# Patient Record
Sex: Female | Born: 1980 | Race: White | Hispanic: No | Marital: Married | Smoking: Current every day smoker
Health system: Southern US, Community
[De-identification: ages and names within clinical notes are randomized; demographics above are authoritative.]

## PROBLEM LIST (undated history)

## (undated) DIAGNOSIS — N809 Endometriosis, unspecified: Secondary | ICD-10-CM

## (undated) DIAGNOSIS — J45909 Unspecified asthma, uncomplicated: Secondary | ICD-10-CM

## (undated) DIAGNOSIS — E119 Type 2 diabetes mellitus without complications: Secondary | ICD-10-CM

## (undated) HISTORY — PX: TUBAL LIGATION: SHX77

---

## 2015-10-14 ENCOUNTER — Emergency Department (HOSPITAL_COMMUNITY): Payer: Self-pay

## 2015-10-14 ENCOUNTER — Encounter (HOSPITAL_COMMUNITY): Payer: Self-pay | Admitting: Emergency Medicine

## 2015-10-14 ENCOUNTER — Emergency Department (HOSPITAL_COMMUNITY)
Admission: EM | Admit: 2015-10-14 | Discharge: 2015-10-14 | Disposition: A | Discharge status: Home / Self Care | Payer: Self-pay | Attending: Emergency Medicine | Admitting: Emergency Medicine

## 2015-10-14 DIAGNOSIS — Z9851 Tubal ligation status: Secondary | ICD-10-CM | POA: Insufficient documentation

## 2015-10-14 DIAGNOSIS — R1031 Right lower quadrant pain: Secondary | ICD-10-CM | POA: Insufficient documentation

## 2015-10-14 DIAGNOSIS — F17210 Nicotine dependence, cigarettes, uncomplicated: Secondary | ICD-10-CM | POA: Insufficient documentation

## 2015-10-14 DIAGNOSIS — Z8742 Personal history of other diseases of the female genital tract: Secondary | ICD-10-CM | POA: Insufficient documentation

## 2015-10-14 DIAGNOSIS — Z880 Allergy status to penicillin: Secondary | ICD-10-CM | POA: Insufficient documentation

## 2015-10-14 DIAGNOSIS — E119 Type 2 diabetes mellitus without complications: Secondary | ICD-10-CM | POA: Insufficient documentation

## 2015-10-14 DIAGNOSIS — R109 Unspecified abdominal pain: Secondary | ICD-10-CM

## 2015-10-14 HISTORY — DX: Type 2 diabetes mellitus without complications: E11.9

## 2015-10-14 HISTORY — DX: Endometriosis, unspecified: N80.9

## 2015-10-14 HISTORY — DX: Unspecified asthma, uncomplicated: J45.909

## 2015-10-14 LAB — BASIC METABOLIC PANEL
Anion gap: 10 (ref 5–15)
BUN: 10 mg/dL (ref 6–20)
CALCIUM: 9.2 mg/dL (ref 8.9–10.3)
CHLORIDE: 108 mmol/L (ref 101–111)
CO2: 22 mmol/L (ref 22–32)
Creatinine, Ser: 0.69 mg/dL (ref 0.44–1.00)
GFR calc Af Amer: >60 mL/min (ref >60)
Glucose, Bld: 76 mg/dL (ref 65–99)
POTASSIUM: 4.3 mmol/L (ref 3.5–5.1)
SODIUM: 140 mmol/L (ref 135–145)

## 2015-10-14 LAB — CBC WITH DIFFERENTIAL/PLATELET
Basophils Absolute: 0 10*3/uL (ref 0.0–0.1)
Basophils Relative: 0 %
EOS ABS: 0.2 10*3/uL (ref 0.0–0.7)
Eosinophils Relative: 2 %
HEMATOCRIT: 39.4 % (ref 36.0–46.0)
HEMOGLOBIN: 12.5 g/dL (ref 12.0–15.0)
LYMPHS ABS: 2.4 10*3/uL (ref 0.7–4.0)
LYMPHS PCT: 30 %
MCH: 25.5 pg — AB (ref 26.0–34.0)
MCHC: 31.7 g/dL (ref 30.0–36.0)
MCV: 80.2 fL (ref 78.0–100.0)
Monocytes Absolute: 0.5 10*3/uL (ref 0.1–1.0)
Monocytes Relative: 6 %
NEUTROS ABS: 4.9 10*3/uL (ref 1.7–7.7)
NEUTROS PCT: 61 %
Platelets: 282 10*3/uL (ref 150–400)
RBC: 4.91 MIL/uL (ref 3.87–5.11)
RDW: 13.5 % (ref 11.5–15.5)
WBC: 7.9 10*3/uL (ref 4.0–10.5)

## 2015-10-14 LAB — I-STAT BETA HCG BLOOD, ED (MC, WL, AP ONLY)

## 2015-10-14 LAB — URINE MICROSCOPIC-ADD ON: WBC, UA: NONE SEEN WBC/hpf (ref 0–5)

## 2015-10-14 LAB — URINALYSIS, ROUTINE W REFLEX MICROSCOPIC
BILIRUBIN URINE: NEGATIVE
Glucose, UA: NEGATIVE mg/dL
KETONES UR: NEGATIVE mg/dL
Leukocytes, UA: NEGATIVE
Nitrite: NEGATIVE
PROTEIN: NEGATIVE mg/dL
Specific Gravity, Urine: 1.011 (ref 1.005–1.030)
pH: 7 (ref 5.0–8.0)

## 2015-10-14 MED ORDER — ONDANSETRON HCL 4 MG/2ML IJ SOLN
4.0000 mg | Freq: Once | INTRAMUSCULAR | Status: AC
  Administered 2015-10-14: 4 mg via INTRAVENOUS
  Filled 2015-10-14: qty 2

## 2015-10-14 MED ORDER — HYDROCODONE-ACETAMINOPHEN 5-325 MG PO TABS: 1.0000 | ORAL_TABLET | Freq: Four times a day (QID) | ORAL | Status: AC | PRN

## 2015-10-14 MED ORDER — MORPHINE SULFATE (PF) 4 MG/ML IV SOLN
4.0000 mg | Freq: Once | INTRAVENOUS | Status: AC
  Administered 2015-10-14: 4 mg via INTRAVENOUS
  Filled 2015-10-14: qty 1

## 2015-10-14 MED ORDER — IOPAMIDOL (ISOVUE-300) INJECTION 61%
INTRAVENOUS | Status: AC
  Administered 2015-10-14: 100 mL
  Filled 2015-10-14: qty 100

## 2015-10-14 NOTE — ED Provider Notes (Signed)
CSN: [id number]     Arrival date & time 10/14/15  4098 History   First MD Initiated Contact with Patient 10/14/15 1005     Chief Complaint  Patient presents with  . Abdominal Pain     (Consider location/radiation/quality/duration/timing/severity/associated sxs/prior Treatment) HPI Comments: Patient presents to the emergency department with chief complaint of right lower quadrant abdominal pain. She states that the pain started yesterday and has progressively worsened. She reports associated nausea, but denies any vomiting or diarrhea. She states that she has had some urinary frequency with large blood clots, but denies any dysuria. She denies ever having had a kidney stone. Denies any prior abdominal surgeries. She states that she does have endometriosis, but states that this feels unlike her typical menstrual pain. She reports that she has on her menstrual cycle now. Additionally, patient complains of having had swollen hands and face this morning, but this is now resolved. She has not taken anything for her symptoms.  The history is provided by the patient. No language interpreter was used.    Past Medical History  Diagnosis Date  . Endometriosis   . Diabetes mellitus without complication [facility])    Past Surgical History  Procedure Laterality Date  . Tubal ligation     No family history on file. Social History  Substance Use Topics  . Smoking status: Current Every Day Smoker -- 0.50 packs/day    Types: Cigarettes  . Smokeless tobacco: None  . Alcohol Use: No   OB History    No data available     Review of Systems  Constitutional: Negative for fever and chills.  Respiratory: Negative for shortness of breath.   Cardiovascular: Negative for chest pain.  Gastrointestinal: Positive for nausea and abdominal pain. Negative for vomiting, diarrhea and constipation.  Genitourinary: Negative for dysuria.  All other systems reviewed and are negative.     Allergies   Penicillins  Home Medications   Prior to Admission medications   Not on File   BP 117/91 mmHg  Pulse 86  SpO2 100%  LMP 10/12/2015 (Exact Date) Physical Exam  Constitutional: She is oriented to person, place, and time. She appears well-developed and well-nourished.  HENT:  Head: Normocephalic and atraumatic.  Eyes: Conjunctivae and EOM are normal. Pupils are equal, round, and reactive to light.  Neck: Normal range of motion. Neck supple.  Cardiovascular: Normal rate and regular rhythm.  Exam reveals no gallop and no friction rub.   No murmur heard. Pulmonary/Chest: Effort normal and breath sounds normal. No respiratory distress. She has no wheezes. She has no rales. She exhibits no tenderness.  Abdominal: Soft. Bowel sounds are normal. She exhibits no distension and no mass. There is tenderness. There is no rebound and no guarding.  Right lower quadrant is tender to palpation, no other focal abdominal tenderness  Musculoskeletal: Normal range of motion. She exhibits no edema or tenderness.  Neurological: She is alert and oriented to person, place, and time.  Skin: Skin is warm and dry.  Skin on hands appears flushed, but no obvious swelling  Psychiatric: She has a normal mood and affect. Her behavior is normal. Judgment and thought content normal.  Nursing note and vitals reviewed.   ED Course  Procedures (including critical care time) Results for orders placed or performed during the hospital encounter of 10/14/15  CBC with Differential/Platelet  Result Value Ref Range   WBC 7.9 4.0 - 10.5 K/uL   RBC 4.91 3.87 - 5.11 MIL/uL   Hemoglobin 12.5  12.0 - 15.0 g/dL   HCT 78.439.4 69.636.0 - 29.546.0 %   MCV 80.2 78.0 - 100.0 fL   MCH 25.5 (L) 26.0 - 34.0 pg   MCHC 31.7 30.0 - 36.0 g/dL   RDW 28.413.5 13.211.5 - 44.015.5 %   Platelets 282 150 - 400 K/uL   Neutrophils Relative % 61 %   Neutro Abs 4.9 1.7 - 7.7 K/uL   Lymphocytes Relative 30 %   Lymphs Abs 2.4 0.7 - 4.0 K/uL   Monocytes Relative 6 %    Monocytes Absolute 0.5 0.1 - 1.0 K/uL   Eosinophils Relative 2 %   Eosinophils Absolute 0.2 0.0 - 0.7 K/uL   Basophils Relative 0 %   Basophils Absolute 0.0 0.0 - 0.1 K/uL  Basic metabolic panel  Result Value Ref Range   Sodium 140 135 - 145 mmol/L   Potassium 4.3 3.5 - 5.1 mmol/L   Chloride 108 101 - 111 mmol/L   CO2 22 22 - 32 mmol/L   Glucose, Bld 76 65 - 99 mg/dL   BUN 10 6 - 20 mg/dL   Creatinine, Ser 1.020.69 0.44 - 1.00 mg/dL   Calcium 9.2 8.9 - 72.510.3 mg/dL   GFR calc non Af Amer >60 >60 mL/min   GFR calc Af Amer >60 >60 mL/min   Anion gap 10 5 - 15  Urinalysis, Routine w reflex microscopic (not at [facility])  Result Value Ref Range   Color, Urine YELLOW YELLOW   APPearance CLEAR CLEAR   Specific Gravity, Urine 1.011 1.005 - 1.030   pH 7.0 5.0 - 8.0   Glucose, UA NEGATIVE NEGATIVE mg/dL   Hgb urine dipstick MODERATE (A) NEGATIVE   Bilirubin Urine NEGATIVE NEGATIVE   Ketones, ur NEGATIVE NEGATIVE mg/dL   Protein, ur NEGATIVE NEGATIVE mg/dL   Nitrite NEGATIVE NEGATIVE   Leukocytes, UA NEGATIVE NEGATIVE  Urine microscopic-add on  Result Value Ref Range   Squamous Epithelial / LPF 0-5 (A) NONE SEEN   WBC, UA NONE SEEN 0 - 5 WBC/hpf   RBC / HPF 0-5 0 - 5 RBC/hpf   Bacteria, UA RARE (A) NONE SEEN  I-Stat Beta hCG blood, ED ([facility], [facility], [facility] only)  Result Value Ref Range   I-stat hCG, quantitative <5.0 <5 mIU/mL   Comment 3           Ct Abdomen Pelvis W Contrast  10/14/2015  CLINICAL DATA:  Right lower quadrant abdomen pain for 2 days. EXAM: CT ABDOMEN AND PELVIS WITH CONTRAST TECHNIQUE: Multidetector CT imaging of the abdomen and pelvis was performed using the standard protocol following bolus administration of intravenous contrast. CONTRAST:  100mL ISOVUE-300 IOPAMIDOL (ISOVUE-300) INJECTION 61% COMPARISON:  None. FINDINGS: Lower chest:  No acute findings. Hepatobiliary: The liver is normal. The gallbladder is partially decompressed. There is mild intrahepatic biliary ductal  dilatation. The common bowel duct is dilated measuring 10 mm. Pancreas: No mass, inflammatory changes, or other significant abnormality. Spleen: Within normal limits in size and appearance. There is a splenule. Adrenals/Urinary Tract: The adrenal glands, kidneys, and bladder are normal. No masses identified. No evidence of hydronephrosis. Stomach/Bowel: There is bowel wall thickening of the distal vertical portion of duodenum. The appendix is normal. No evidence of obstruction, inflammatory process, or abnormal fluid collections. Vascular/Lymphatic: No pathologically enlarged lymph nodes. No evidence of abdominal aortic aneurysm. Reproductive: No mass or other significant abnormality. Small cysts in normal size bilateral ovaries are noted. Other: None. Musculoskeletal:  No suspicious bone lesions identified. IMPRESSION: Normal appendix.  There is no evidence of appendicitis. Intra and extrahepatic biliary ductal dilatation. The gallbladder is decompressed limiting evaluation. There is bowel wall thickening of the distal vertical portion of the duodenum. Given the constellation of findings, consider endoscopy for further evaluation if clinically indicated. Electronically Signed   By: Sherian Rein M.D.   On: 10/14/2015 13:10    I have personally reviewed and evaluated these images and lab results as part of my medical decision-making.    MDM   Final diagnoses:  Abdominal pain, unspecified abdominal location   Patient with right lower quadrant abdominal pain that started yesterday. She has a history of endometriosis, but states this feels unlike that. No history of kidney stones. She is quite tender on exam. We'll treat pain, check CT scan, and labs. Will reassess.  CT scan shows normal appendix.  Also shows some bowel wall thickening of the duodenum. No GERD symptoms. No right upper quadrant pain.  Discussed these findings with Dr. Juleen China.  Patient feels improved. Will discharge to home. Recommend  primary care/OB/GYN follow-up.  Symptoms could be related to endometriosis, versus dysmenorrhea. No evidence of appendicitis, no hydronephrosis. Will discharge to home.  Roxy Horseman, PA-C 10/14/15 [address], PA-C 10/14/15 1331  Raeford Razor, MD 10/22/15 1309

## 2015-10-14 NOTE — ED Notes (Signed)
Pt to ER by private vehicle with complaint of RLQ abdominal pain onset 2 days ago. Pt has endometriosis and is currently on menstrual cycle. Pt also reports urinary frequency with large blood clots in urine. Pt is alert and oriented x4. NAD. Pt reports she also feels swollen in hands and face.

## 2015-10-14 NOTE — Discharge Instructions (Signed)

## 2016-09-24 IMAGING — CT CT ABD-PELV W/ CM
2 of 4 series · 16 of 46 positions shown, 18 images · IV contrast (iopamidol)
Comparison: None.

CLINICAL DATA: Right lower quadrant abdomen pain for 2 days.

EXAM:
CT ABDOMEN AND PELVIS WITH CONTRAST
TECHNIQUE: Multidetector CT imaging of the abdomen and pelvis was performed
using the standard protocol following bolus administration of
intravenous contrast.
CONTRAST:  100mL UBSDE7-VZZ IOPAMIDOL (UBSDE7-VZZ) INJECTION 61%

[Series 2: abd/ pelvis 5.0 i30f 1 · axial · 0.71mm/px · z∈[-423,+17]mm · 13 of 98 slices shown, 15 images]
[im 5/98  soft-tissue]
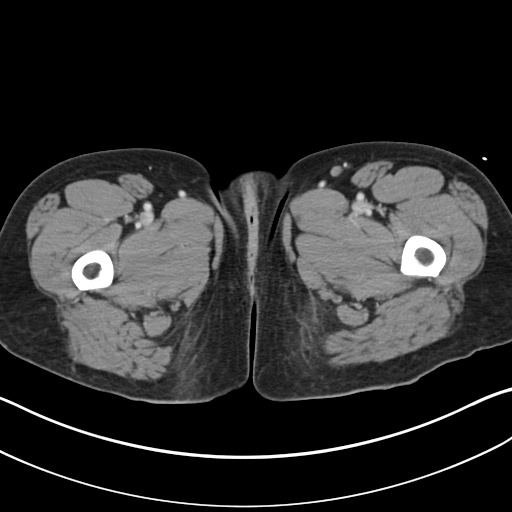
[im 5/98  bone]
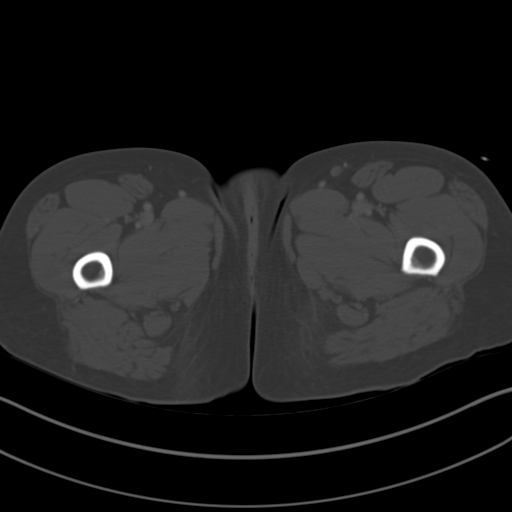
[im 13/98  soft-tissue]
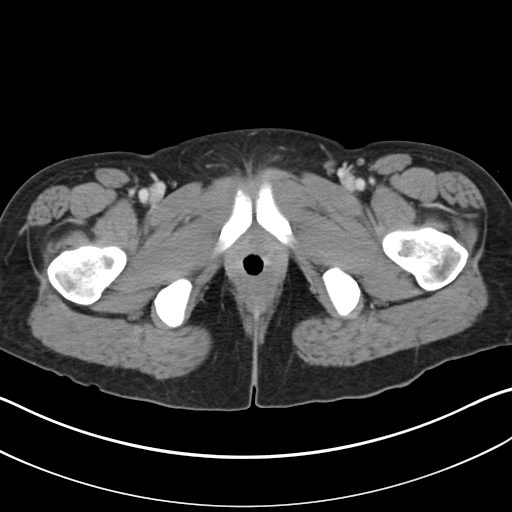
[im 22/98  soft-tissue]
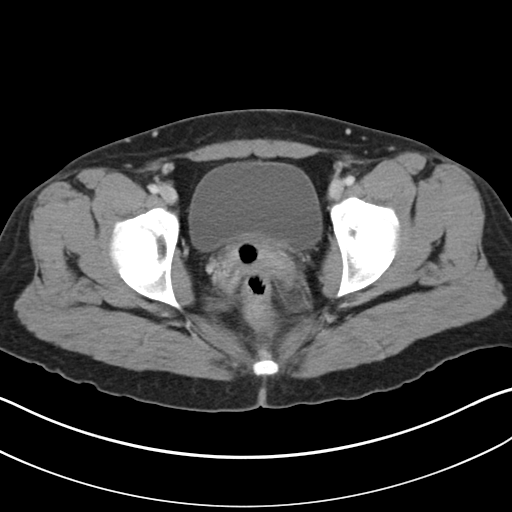
[im 26/98  soft-tissue]
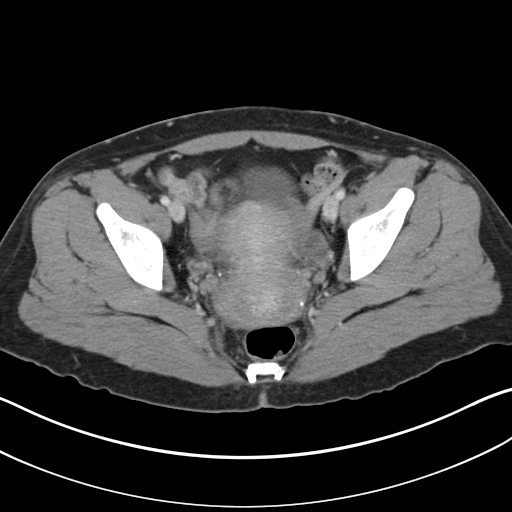
[im 34/98  soft-tissue]
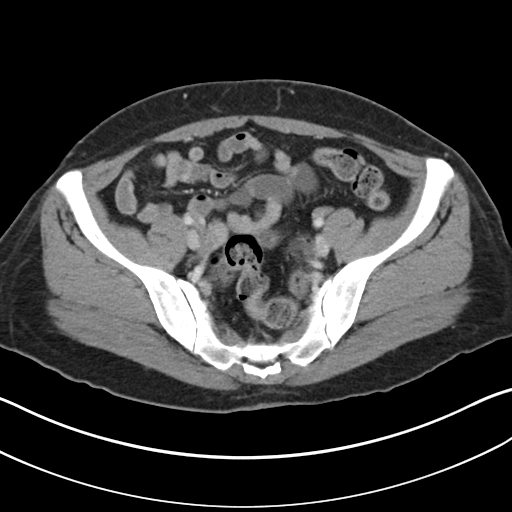
[im 43/98  soft-tissue]
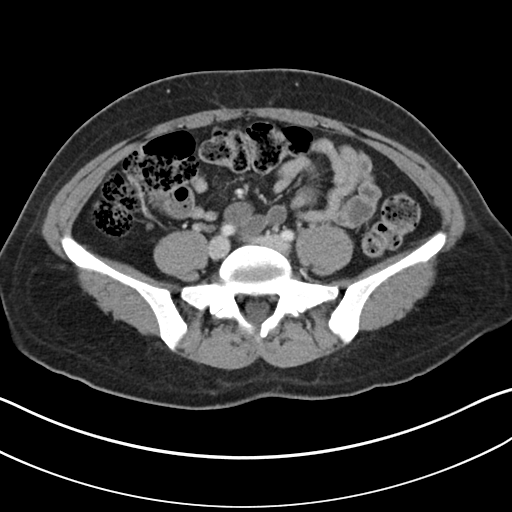
[im 51/98  soft-tissue]
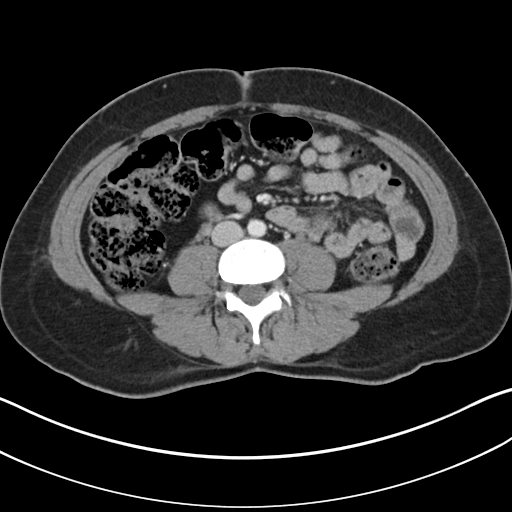
[im 55/98  soft-tissue]
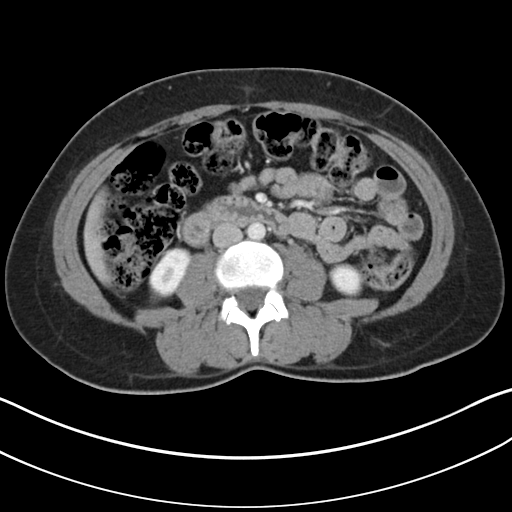
[im 64/98  soft-tissue]
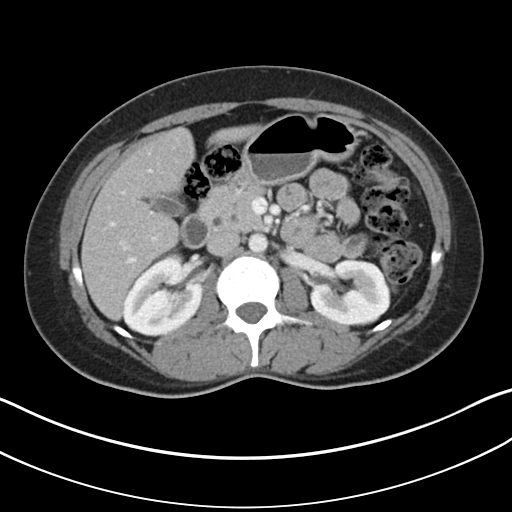
[im 64/98  bone]
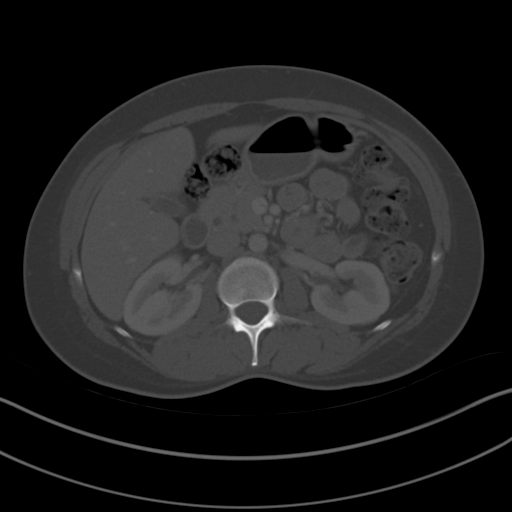
[im 72/98  soft-tissue]
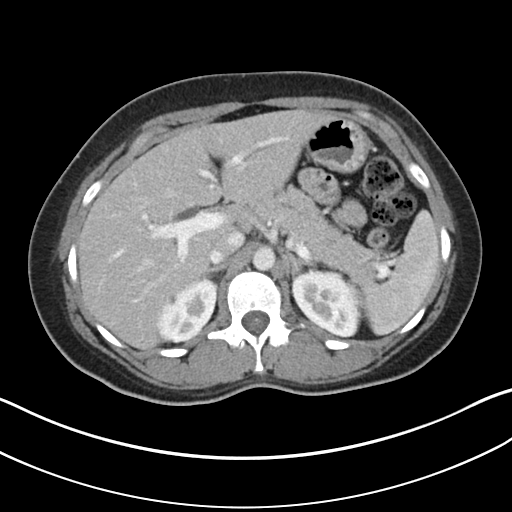
[im 76/98  soft-tissue]
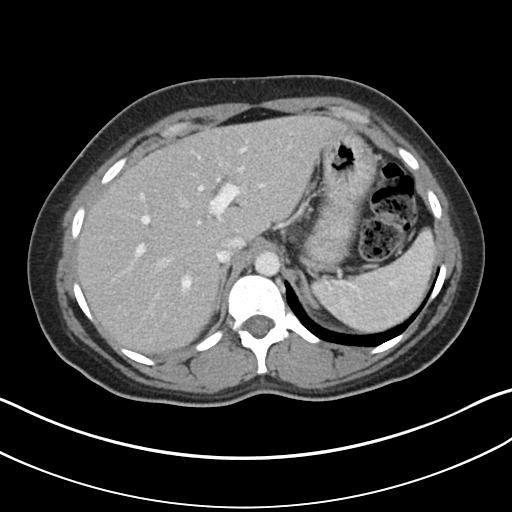
[im 85/98  soft-tissue]
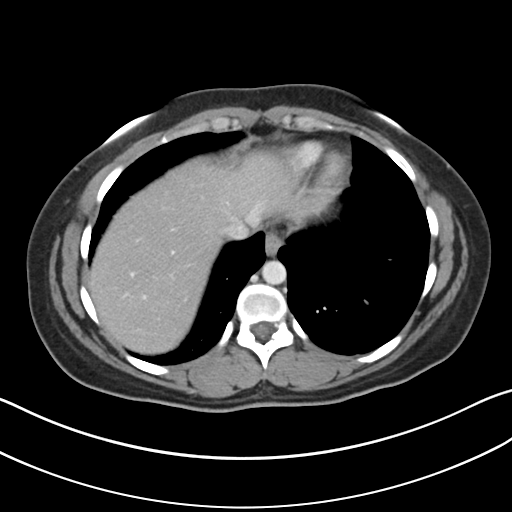
[im 93/98  soft-tissue]
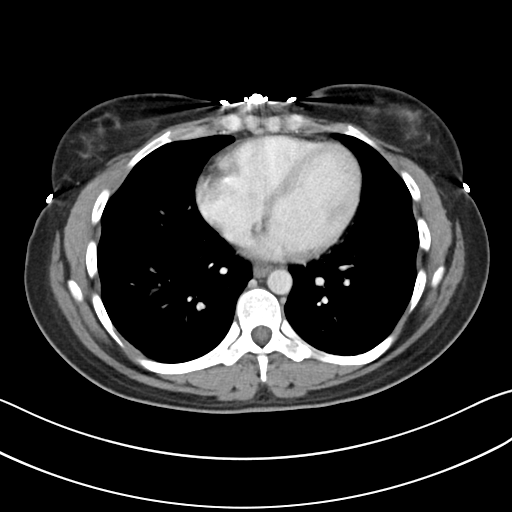

[Series 5: coronal soft tissue · coronal · 0.72mm/px · 3 of 76 slices shown]
[im 26/76  soft-tissue]
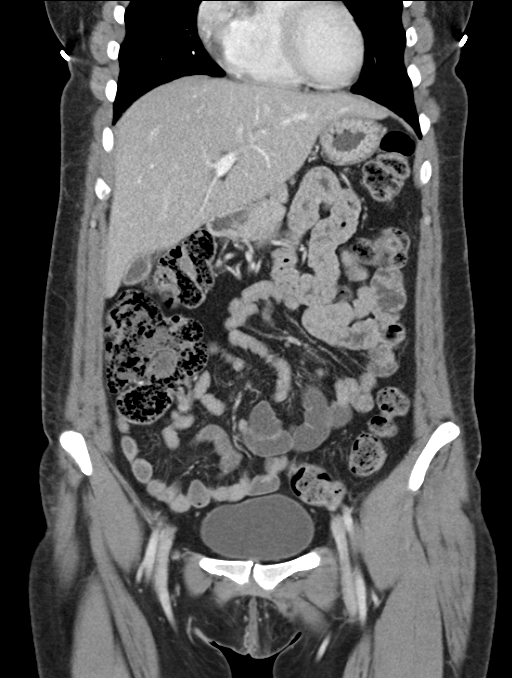
[im 34/76  soft-tissue]
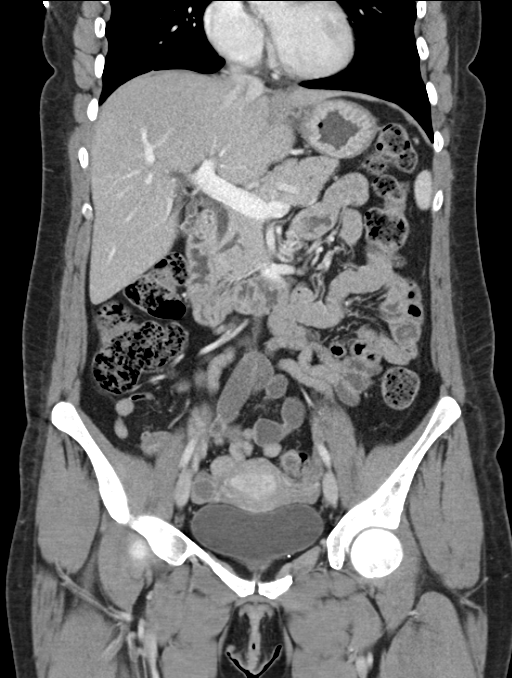
[im 42/76  soft-tissue]
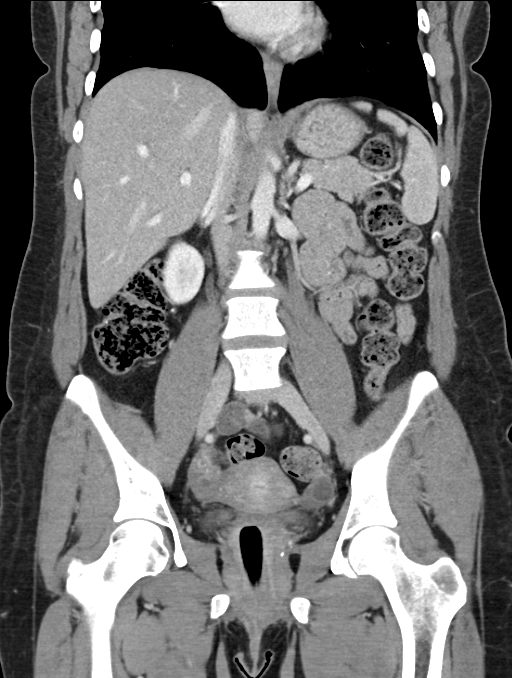

[16 of 46 positions shown; findings below may reference images not displayed]

FINDINGS: Lower chest:  No acute findings.

Hepatobiliary: The liver is normal. The gallbladder is partially
decompressed. There is mild intrahepatic biliary ductal dilatation.
The common bowel duct is dilated measuring 10 mm.

Pancreas: No mass, inflammatory changes, or other significant
abnormality.

Spleen: Within normal limits in size and appearance. There is a
splenule.

Adrenals/Urinary Tract: The adrenal glands, kidneys, and bladder are
normal. No masses identified. No evidence of hydronephrosis.

Stomach/Bowel: There is bowel wall thickening of the distal vertical
portion of duodenum. The appendix is normal. No evidence of
obstruction, inflammatory process, or abnormal fluid collections.

Vascular/Lymphatic: No pathologically enlarged lymph nodes. No
evidence of abdominal aortic aneurysm.

Reproductive: No mass or other significant abnormality. Small cysts
in normal size bilateral ovaries are noted.

Other: None.

Musculoskeletal:  No suspicious bone lesions identified.
IMPRESSION: Normal appendix.  There is no evidence of appendicitis.

Intra and extrahepatic biliary ductal dilatation. The gallbladder is
decompressed limiting evaluation. There is bowel wall thickening of
the distal vertical portion of the duodenum. Given the constellation
of findings, consider endoscopy for further evaluation if clinically
indicated.
